# Patient Record
Sex: Female | Born: 1990 | Race: Black or African American | Hispanic: No | Marital: Single | Smoking: Never smoker
Health system: Southern US, Community
[De-identification: ages and names within clinical notes are randomized; demographics above are authoritative.]

## PROBLEM LIST (undated history)

## (undated) DIAGNOSIS — J45909 Unspecified asthma, uncomplicated: Secondary | ICD-10-CM

## (undated) HISTORY — PX: TONSILLECTOMY: SUR1361

## (undated) HISTORY — PX: CHOLECYSTECTOMY: SHX55

---

## 2015-07-16 ENCOUNTER — Encounter (HOSPITAL_COMMUNITY): Payer: Self-pay | Admitting: Emergency Medicine

## 2015-07-16 ENCOUNTER — Emergency Department (HOSPITAL_COMMUNITY): Payer: Self-pay

## 2015-07-16 ENCOUNTER — Emergency Department (HOSPITAL_COMMUNITY)
Admission: EM | Admit: 2015-07-16 | Discharge: 2015-07-17 | Disposition: A | Discharge status: Home / Self Care | Payer: Self-pay | Attending: Emergency Medicine | Admitting: Emergency Medicine

## 2015-07-16 DIAGNOSIS — O26899 Other specified pregnancy related conditions, unspecified trimester: Secondary | ICD-10-CM

## 2015-07-16 DIAGNOSIS — R102 Pelvic and perineal pain: Secondary | ICD-10-CM

## 2015-07-16 DIAGNOSIS — Z3A14 14 weeks gestation of pregnancy: Secondary | ICD-10-CM | POA: Insufficient documentation

## 2015-07-16 DIAGNOSIS — Z79899 Other long term (current) drug therapy: Secondary | ICD-10-CM | POA: Insufficient documentation

## 2015-07-16 DIAGNOSIS — J45909 Unspecified asthma, uncomplicated: Secondary | ICD-10-CM | POA: Insufficient documentation

## 2015-07-16 DIAGNOSIS — O200 Threatened abortion: Secondary | ICD-10-CM | POA: Insufficient documentation

## 2015-07-16 DIAGNOSIS — O209 Hemorrhage in early pregnancy, unspecified: Secondary | ICD-10-CM

## 2015-07-16 DIAGNOSIS — O219 Vomiting of pregnancy, unspecified: Secondary | ICD-10-CM | POA: Insufficient documentation

## 2015-07-16 HISTORY — DX: Unspecified asthma, uncomplicated: J45.909

## 2015-07-16 LAB — COMPREHENSIVE METABOLIC PANEL WITH GFR
ALT: 14 U/L (ref 14–54)
AST: 14 U/L — ABNORMAL LOW (ref 15–41)
Albumin: 4.1 g/dL (ref 3.5–5.0)
Alkaline Phosphatase: 54 U/L (ref 38–126)
Anion gap: 11 (ref 5–15)
BUN: 9 mg/dL (ref 6–20)
CO2: 21 mmol/L — ABNORMAL LOW (ref 22–32)
Calcium: 9.5 mg/dL (ref 8.9–10.3)
Chloride: 104 mmol/L (ref 101–111)
Creatinine, Ser: 0.38 mg/dL — ABNORMAL LOW (ref 0.44–1.00)
GFR calc Af Amer: >60 mL/min
GFR calc non Af Amer: >60 mL/min
Glucose, Bld: 92 mg/dL (ref 65–99)
Potassium: 3.5 mmol/L (ref 3.5–5.1)
Sodium: 136 mmol/L (ref 135–145)
Total Bilirubin: 0.6 mg/dL (ref 0.3–1.2)
Total Protein: 8.3 g/dL — ABNORMAL HIGH (ref 6.5–8.1)

## 2015-07-16 LAB — CBC WITH DIFFERENTIAL/PLATELET
Basophils Absolute: 0 K/uL (ref 0.0–0.1)
Basophils Relative: 0 %
Eosinophils Absolute: 0 K/uL (ref 0.0–0.7)
Eosinophils Relative: 0 %
HCT: 37.6 % (ref 36.0–46.0)
Hemoglobin: 12.9 g/dL (ref 12.0–15.0)
Lymphocytes Relative: 26 %
Lymphs Abs: 1.9 K/uL (ref 0.7–4.0)
MCH: 30.9 pg (ref 26.0–34.0)
MCHC: 34.3 g/dL (ref 30.0–36.0)
MCV: 90 fL (ref 78.0–100.0)
Monocytes Absolute: 0.6 K/uL (ref 0.1–1.0)
Monocytes Relative: 8 %
Neutro Abs: 4.8 K/uL (ref 1.7–7.7)
Neutrophils Relative %: 66 %
Platelets: 356 K/uL (ref 150–400)
RBC: 4.18 MIL/uL (ref 3.87–5.11)
RDW: 12.9 % (ref 11.5–15.5)
WBC: 7.4 K/uL (ref 4.0–10.5)

## 2015-07-16 LAB — TYPE AND SCREEN
ABO/RH(D): O POS
Antibody Screen: NEGATIVE

## 2015-07-16 LAB — HCG, QUANTITATIVE, PREGNANCY: hCG, Beta Chain, Quant, S: 25687 m[IU]/mL — ABNORMAL HIGH

## 2015-07-16 LAB — ABO/RH: ABO/RH(D): O POS

## 2015-07-16 MED ORDER — ONDANSETRON HCL 4 MG/2ML IJ SOLN
4.0000 mg | Freq: Once | INTRAMUSCULAR | Status: DC
  Filled 2015-07-16: qty 2

## 2015-07-16 MED ORDER — SODIUM CHLORIDE 0.9 % IV BOLUS (SEPSIS)
1000.0000 mL | Freq: Once | INTRAVENOUS | Status: AC
  Administered 2015-07-16: 1000 mL via INTRAVENOUS

## 2015-07-16 MED ORDER — MORPHINE SULFATE (PF) 4 MG/ML IV SOLN
4.0000 mg | INTRAVENOUS | Status: DC | PRN
  Administered 2015-07-16: 4 mg via INTRAVENOUS
  Filled 2015-07-16: qty 1

## 2015-07-16 MED ORDER — METOCLOPRAMIDE HCL 5 MG/ML IJ SOLN
10.0000 mg | Freq: Once | INTRAMUSCULAR | Status: AC
  Administered 2015-07-16: 10 mg via INTRAVENOUS
  Filled 2015-07-16: qty 2

## 2015-07-16 NOTE — ED Notes (Signed)
Pt states [redacted] weeks pregnant c/o cramping and abdominal pain with heavy vaginal bleeding. Pt reports vomiting all day and hasn't been able to drink or eat all day.

## 2015-07-16 NOTE — ED Notes (Signed)
Dr. Fayrene FearingJames at bedside to talk to patient

## 2015-07-16 NOTE — ED Notes (Signed)
Pt reports lower abd cramping, with "heavy vaginal bleeding with clots" pt states [facility] but now worse. Pt refusing to change into gown and is refusing pelvic exam at this time. Pt is requesting medication.

## 2015-07-16 NOTE — ED Notes (Signed)
Pelvic cart at bedside. 

## 2015-07-16 NOTE — ED Notes (Signed)
Called to room by mother, pt yelling at Clinical research associatewriter c/o treatment she has received and complaints she has had nothing done for her. Pt continues to state she is refusing pelvic and to change clothes, attempts made to educate patient on why administering a Tylenol may not be the best option at this point and importance of proper exam. Pt and mother advised all test are back and MD will by able to discuss results on evaluation. Apologies made to pt and family regarding wait times.

## 2015-07-17 MED ORDER — ONDANSETRON 4 MG PO TBDP: 4.0000 mg | ORAL_TABLET | Freq: Three times a day (TID) | ORAL | Status: AC | PRN

## 2015-07-17 MED ORDER — DOXYLAMINE-PYRIDOXINE 10-10 MG PO TBEC: 1.0000 | DELAYED_RELEASE_TABLET | Freq: Three times a day (TID) | ORAL | Status: AC | PRN

## 2015-07-17 NOTE — Discharge Instructions (Signed)

## 2015-07-17 NOTE — ED Notes (Signed)
Patient d/c'd self care.  F/u and medications discussed.  Patient verbalized understanding. 

## 2015-07-17 NOTE — ED Provider Notes (Signed)
CSN: [id number]     Arrival date & time 07/16/15  1700 History   First MD Initiated Contact with Patient 07/16/15 2217     Chief Complaint  Patient presents with  . Abdominal Pain  . Vaginal Bleeding      HPI  Patient presents for evaluation of abdominal pain and vaginal bleeding. She is [redacted] weeks pregnant by her dates. Has had a lot of nausea and vomiting over the course of her pregnancy. Started on Reglan by her OB/GYN a few weeks ago. Vomiting for 4-5 days bleeding for 3-4 days. Seen by OB/GYN 2 days ago. [place] Had an ultrasound a pelvic exam and was told "everything was fine". Worsening bleeding today now with some clots. Not lightheaded or syncopal. Some abdominal cramping nausea and vomiting.  Past Medical History  Diagnosis Date  . Asthma    Past Surgical History  Procedure Laterality Date  . Cholecystectomy    . Tonsillectomy     No family history on file. Social History  Substance Use Topics  . Smoking status: Never Smoker   . Smokeless tobacco: None  . Alcohol Use: No   OB History    No data available     Review of Systems  Constitutional: Negative for fever, chills, diaphoresis, appetite change and fatigue.  HENT: Negative for mouth sores, sore throat and trouble swallowing.   Eyes: Negative for visual disturbance.  Respiratory: Negative for cough, chest tightness, shortness of breath and wheezing.   Cardiovascular: Negative for chest pain.  Gastrointestinal: Positive for nausea, vomiting and abdominal pain. Negative for diarrhea and abdominal distention.  Endocrine: Negative for polydipsia, polyphagia and polyuria.  Genitourinary: Positive for vaginal bleeding. Negative for dysuria, frequency and hematuria.  Musculoskeletal: Negative for gait problem.  Skin: Negative for color change, pallor and rash.  Neurological: Negative for dizziness, syncope, light-headedness and headaches.  Hematological: Does not bruise/bleed easily.    Psychiatric/Behavioral: Negative for behavioral problems and confusion.      Allergies  Naproxen; Penicillins; and Vicodin  Home Medications   Prior to Admission medications   Medication Sig Start Date End Date Taking? Authorizing Provider  acetaminophen (TYLENOL) 325 MG tablet Take 650 mg by mouth every 6 (six) hours as needed for mild pain, moderate pain or headache.    Yes Historical Provider, MD  Doxylamine-Pyridoxine (DICLEGIS) 10-10 MG TBEC Take 1 tablet by mouth 3 (three) times daily as needed. 07/17/15   Rolland Porter, MD  ondansetron (ZOFRAN ODT) 4 MG disintegrating tablet Take 1 tablet (4 mg total) by mouth every 8 (eight) hours as needed for nausea. 07/17/15   Rolland Porter, MD  Prenatal MV-Min-FA-Omega-3 (PRENATAL GUMMIES/DHA & FA) 0.4-32.5 MG CHEW Chew 1 tablet by mouth daily.   Yes Historical Provider, MD   BP 118/50 mmHg  Pulse 92  Temp(Src) 99.8 F (37.7 C) (Oral)  Resp 18  SpO2 100%  LMP 04/09/2014 Physical Exam  Constitutional: She is oriented to person, place, and time. She appears well-developed and well-nourished. No distress.  HENT:  Head: Normocephalic.  Eyes: Conjunctivae are normal. Pupils are equal, round, and reactive to light. No scleral icterus.  Neck: Normal range of motion. Neck supple. No thyromegaly present.  Cardiovascular: Normal rate and regular rhythm.  Exam reveals no gallop and no friction rub.   No murmur heard. Pulmonary/Chest: Effort normal and breath sounds normal. No respiratory distress. She has no wheezes. She has no rales.  Abdominal: Soft. Bowel sounds are normal. She exhibits no  distension. There is no tenderness. There is no rebound.  Genitourinary:    Musculoskeletal: Normal range of motion.  Neurological: She is alert and oriented to person, place, and time.  Skin: Skin is warm and dry. No rash noted.  Psychiatric: She has a normal mood and affect. Her behavior is normal.    ED Course  Procedures (including critical care  time) Labs Review Labs Reviewed  HCG, QUANTITATIVE, PREGNANCY - Abnormal; Notable for the following:    hCG, Beta Chain, Mahalia LongestQuant, S [id number] (*)    All other components within normal limits  COMPREHENSIVE METABOLIC PANEL - Abnormal; Notable for the following:    CO2 21 (*)    Creatinine, Ser 0.38 (*)    Total Protein 8.3 (*)    AST 14 (*)    All other components within normal limits  CBC WITH DIFFERENTIAL/PLATELET  TYPE AND SCREEN  ABO/RH    Imaging Review [place] [facility] Limited  07/16/2015  CLINICAL DATA:  Patient with pelvic pain and vaginal bleeding. EXAM: LIMITED OBSTETRIC ULTRASOUND FINDINGS: Number of Fetuses:  1 Heart Rate:  144 bpm Movement:  Present Presentation: Variable Placental Location: Anterior Previa: No Amniotic Fluid (Subjective):  Within normal limits. BPD:  2.3cm 13w  5d MATERNAL FINDINGS: Uterus/Adnexae:  No abnormality visualized. IMPRESSION: Single live intrauterine gestation. No definite acute process identified. This exam is performed on an emergent basis and does not comprehensively evaluate fetal size, dating, or anatomy; follow-up complete OB US should be considered if further fetal assessment is warranted. Electronically Signed   By: Annia Beltrew  Davis M.D.   On: 07/16/2015 20:24   I have personally reviewed and evaluated these images and lab results as part of my medical decision-making.   EKG Interpretation None      MDM   Final diagnoses:  Threatened miscarriage    Closed off on pelvic exam. Viable IUP on ultrasound. Feeling better after IV fluids antiemetics and pain medication. Discharged home. Return precautions including heavy bleeding severe pain. Otherwise OB/GYN follow-up.    Rolland PorterMark Leslee Suire, MD 07/17/15 [phone]

## 2017-02-26 IMAGING — US US OB LIMITED
1 series · 14 of 28 positions shown · non-contrast
Comparison: none

CLINICAL DATA: Patient with pelvic pain and vaginal bleeding.

EXAM:
LIMITED OBSTETRIC ULTRASOUND

[Series 1: us ob limited · 0.25mm/px · 40 acquisitions, 14 frames shown]
[im 2/40]
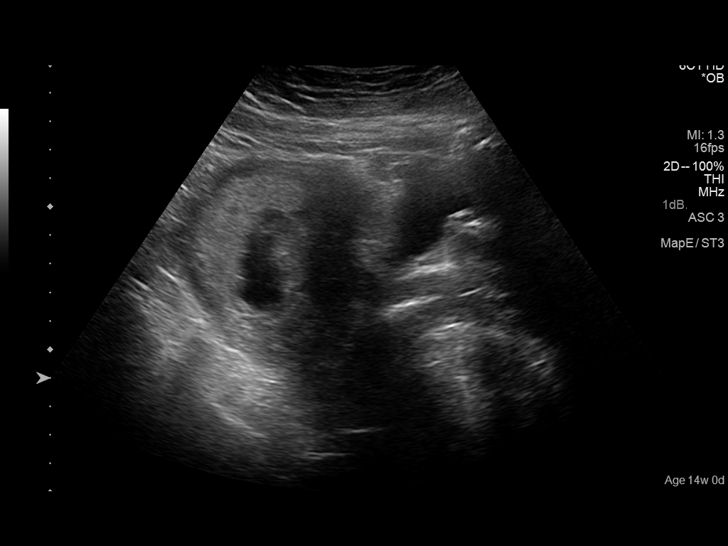
[im 5/40]
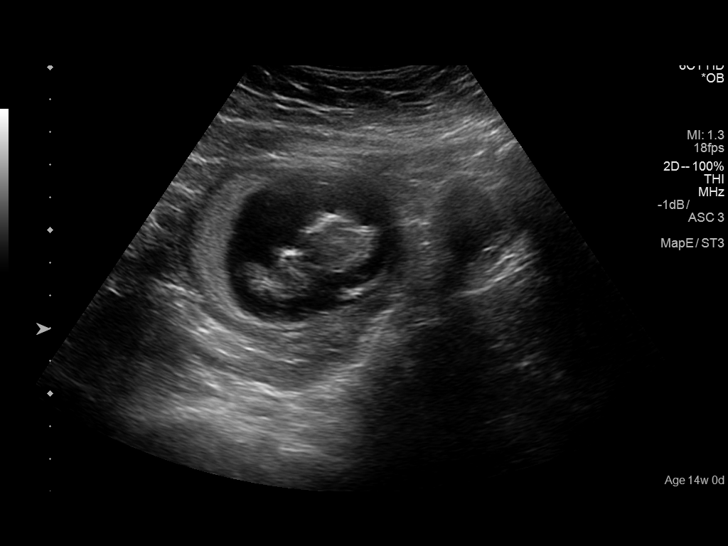
[im 8/40]
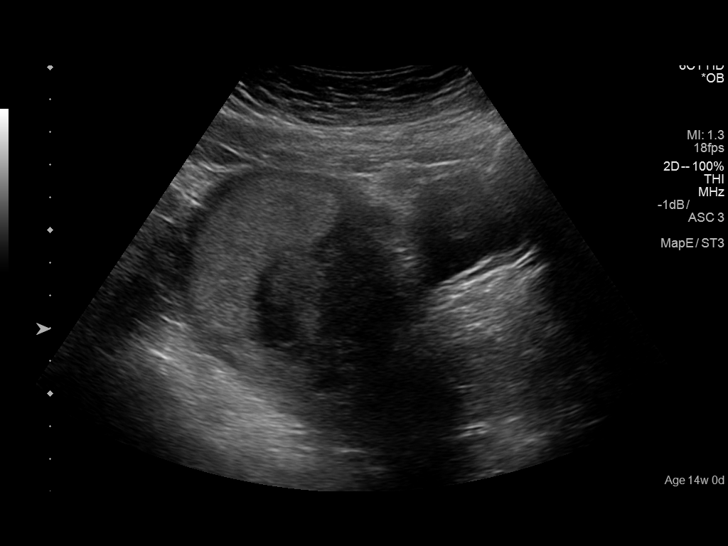
[im 11/40]
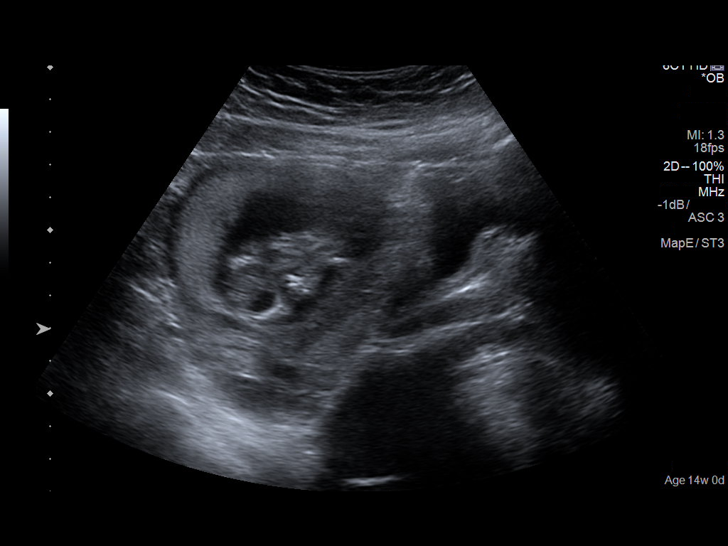
[im 14/40]
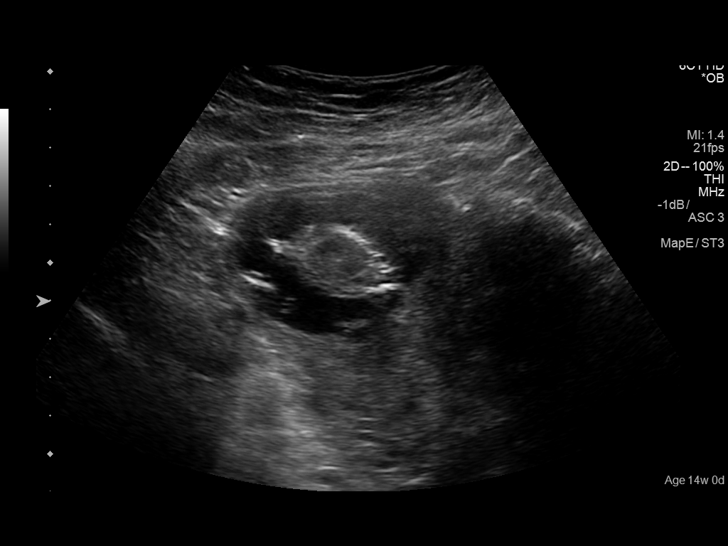
[im 16/40]
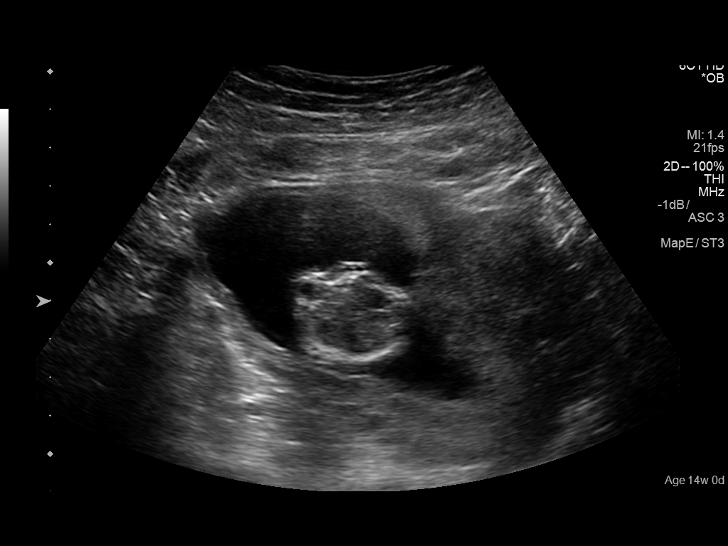
[im 19/40]
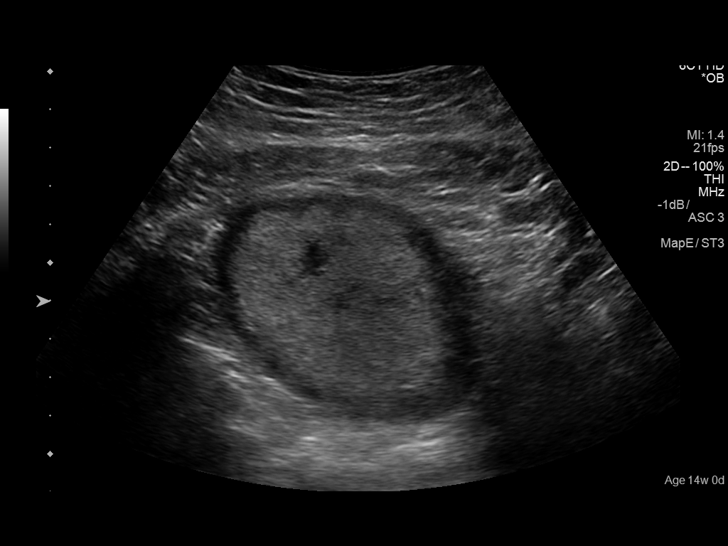
[im 22/40]
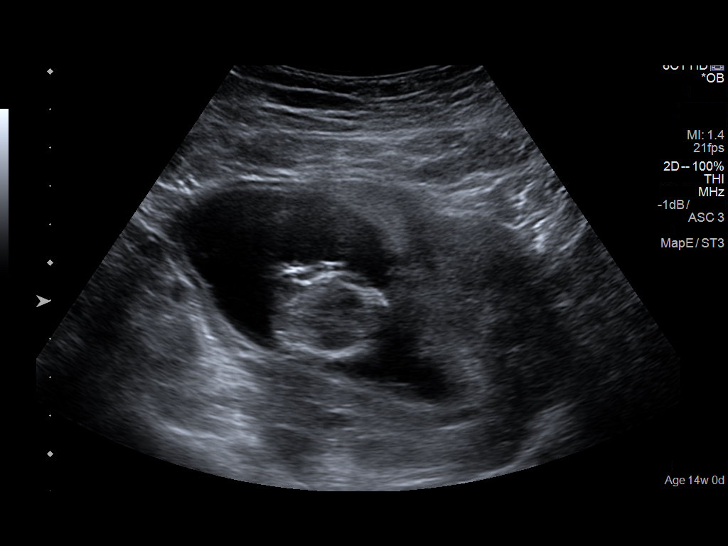
[im 25/40]
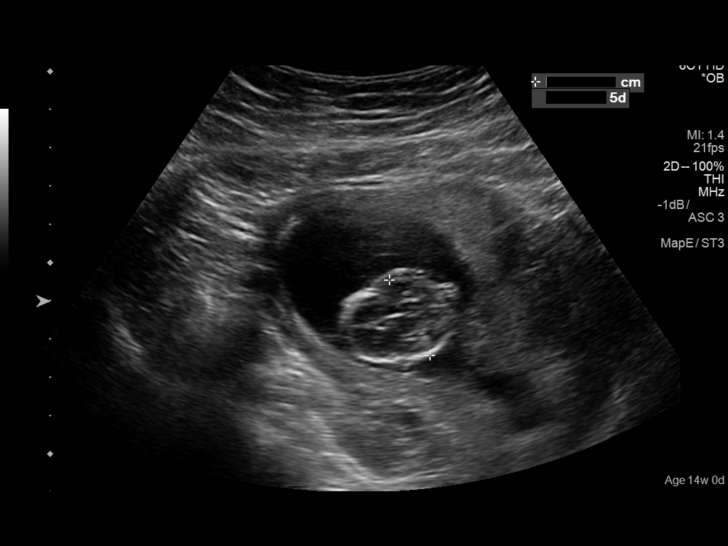
[im 28/40]
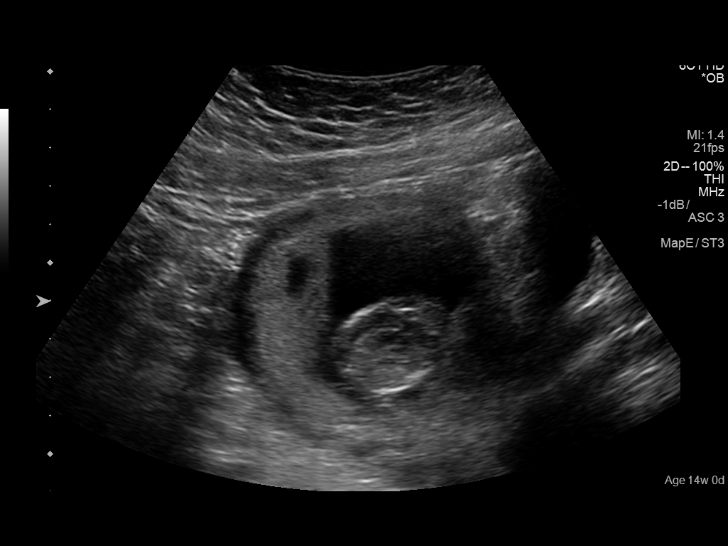
[im 31/40]
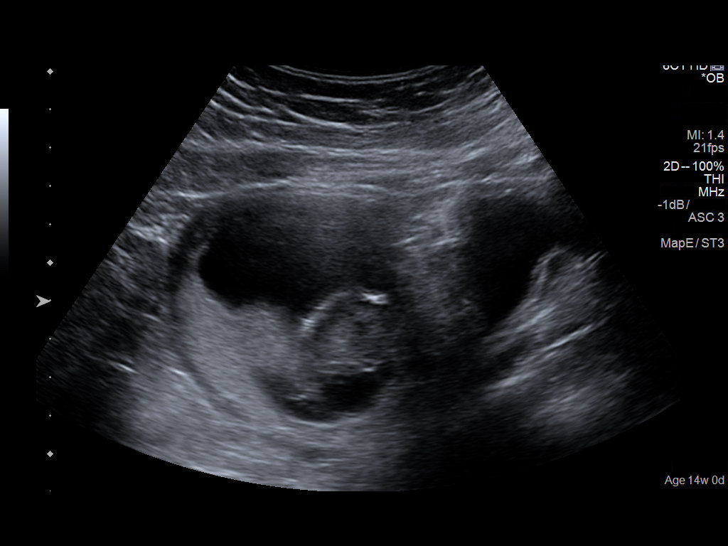
[im 34/40]
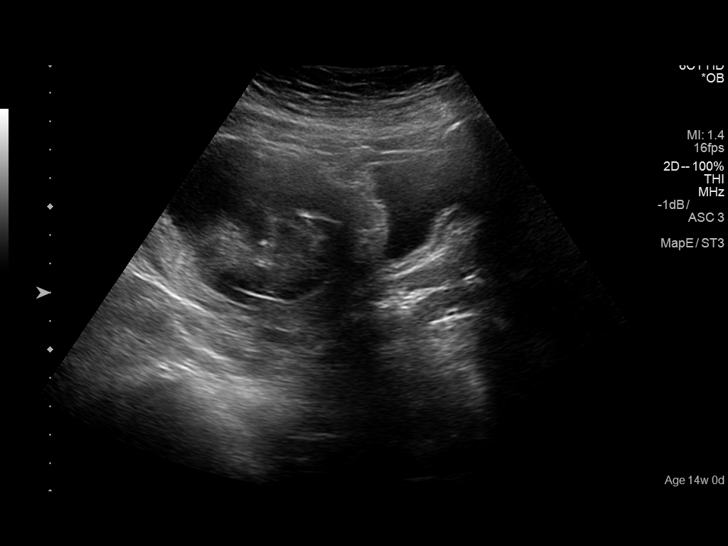
[im 37/40]
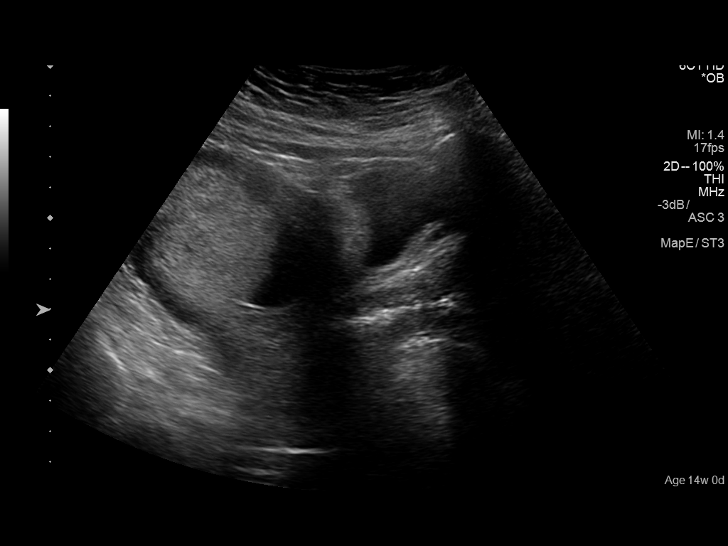
[im 40/40]
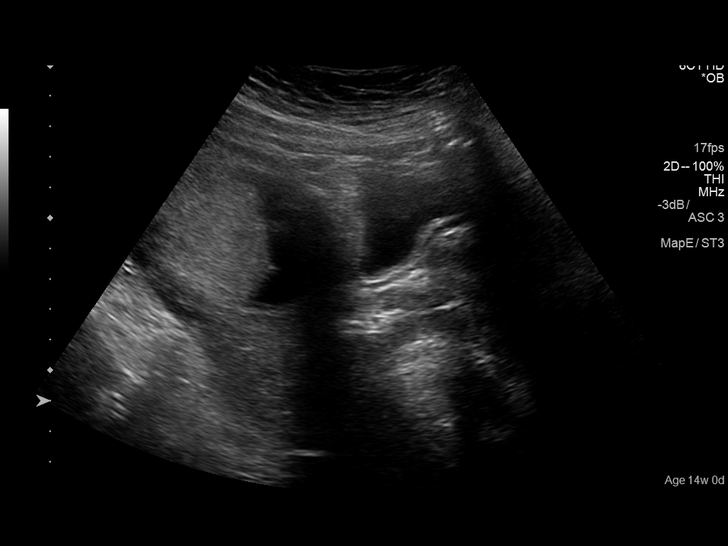

[14 of 28 positions shown; findings below may reference images not displayed]

FINDINGS: Number of Fetuses:  1

Heart Rate:  144 bpm

Movement:  Present

Presentation: Variable

Placental Location: Anterior

Previa: No

Amniotic Fluid (Subjective):  Within normal limits.

BPD:  2.3cm 13w  5d

MATERNAL FINDINGS:

Uterus/Adnexae:  No abnormality visualized.
IMPRESSION: Single live intrauterine gestation. No definite acute process
identified.

This exam is performed on an emergent basis and does not
comprehensively evaluate fetal size, dating, or anatomy; follow-up
complete OB US should be considered if further fetal assessment is
warranted.
